# Patient Record
Sex: Female | Born: 2005 | Race: White | Hispanic: No | Marital: Single | Smoking: Never smoker
Health system: Southern US, Community
[De-identification: ages and names within clinical notes are randomized; demographics above are authoritative.]

## PROBLEM LIST (undated history)

## (undated) DIAGNOSIS — G039 Meningitis, unspecified: Secondary | ICD-10-CM

---
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
## 2023-04-27 ENCOUNTER — Encounter (HOSPITAL_COMMUNITY): Payer: Self-pay

## 2023-04-27 ENCOUNTER — Other Ambulatory Visit: Payer: Self-pay

## 2023-04-27 ENCOUNTER — Emergency Department (HOSPITAL_COMMUNITY): Payer: Medicaid Other

## 2023-04-27 ENCOUNTER — Emergency Department (HOSPITAL_COMMUNITY)
Admission: EM | Admit: 2023-04-27 | Discharge: 2023-04-28 | Disposition: A | Discharge status: Home / Self Care | Payer: Medicaid Other | Attending: Emergency Medicine | Admitting: Emergency Medicine

## 2023-04-27 DIAGNOSIS — Z20822 Contact with and (suspected) exposure to covid-19: Secondary | ICD-10-CM | POA: Diagnosis not present

## 2023-04-27 DIAGNOSIS — R000 Tachycardia, unspecified: Secondary | ICD-10-CM | POA: Diagnosis not present

## 2023-04-27 DIAGNOSIS — M25559 Pain in unspecified hip: Secondary | ICD-10-CM | POA: Insufficient documentation

## 2023-04-27 DIAGNOSIS — R519 Headache, unspecified: Secondary | ICD-10-CM | POA: Diagnosis present

## 2023-04-27 DIAGNOSIS — F0781 Postconcussional syndrome: Secondary | ICD-10-CM | POA: Diagnosis not present

## 2023-04-27 DIAGNOSIS — B349 Viral infection, unspecified: Secondary | ICD-10-CM | POA: Insufficient documentation

## 2023-04-27 DIAGNOSIS — R109 Unspecified abdominal pain: Secondary | ICD-10-CM | POA: Diagnosis not present

## 2023-04-27 LAB — CBC WITH DIFFERENTIAL/PLATELET
Abs Immature Granulocytes: 0.01 10*3/uL (ref 0.00–0.07)
Basophils Absolute: 0 10*3/uL (ref 0.0–0.1)
Basophils Relative: 0 %
Eosinophils Absolute: 0 10*3/uL (ref 0.0–0.5)
Eosinophils Relative: 0 %
HCT: 43.4 % (ref 36.0–46.0)
Hemoglobin: 14.2 g/dL (ref 12.0–15.0)
Immature Granulocytes: 0 %
Lymphocytes Relative: 4 %
Lymphs Abs: 0.2 10*3/uL — ABNORMAL LOW (ref 0.7–4.0)
MCH: 29.6 pg (ref 26.0–34.0)
MCHC: 32.7 g/dL (ref 30.0–36.0)
MCV: 90.4 fL (ref 80.0–100.0)
Monocytes Absolute: 0.4 10*3/uL (ref 0.1–1.0)
Monocytes Relative: 6 %
Neutro Abs: 5.5 10*3/uL (ref 1.7–7.7)
Neutrophils Relative %: 90 %
Platelets: 209 10*3/uL (ref 150–400)
RBC: 4.8 MIL/uL (ref 3.87–5.11)
RDW: 13.3 % (ref 11.5–15.5)
WBC: 6.2 10*3/uL (ref 4.0–10.5)
nRBC: 0 % (ref 0.0–0.2)

## 2023-04-27 LAB — URINALYSIS, ROUTINE W REFLEX MICROSCOPIC
Bilirubin Urine: NEGATIVE
Glucose, UA: NEGATIVE mg/dL
Ketones, ur: 20 mg/dL — AB
Leukocytes,Ua: NEGATIVE
Nitrite: NEGATIVE
Protein, ur: 100 mg/dL — AB
RBC / HPF: >50 RBC/hpf (ref 0–5)
Specific Gravity, Urine: 1.03 (ref 1.005–1.030)
Trans Epithel, UA: 1
pH: 5 (ref 5.0–8.0)

## 2023-04-27 LAB — BASIC METABOLIC PANEL WITH GFR
Anion gap: 10 (ref 5–15)
BUN: 18 mg/dL (ref 6–20)
CO2: 20 mmol/L — ABNORMAL LOW (ref 22–32)
Calcium: 8.7 mg/dL — ABNORMAL LOW (ref 8.9–10.3)
Chloride: 102 mmol/L (ref 98–111)
Creatinine, Ser: 1 mg/dL (ref 0.44–1.00)
GFR, Estimated: >60 mL/min (ref >60)
Glucose, Bld: 109 mg/dL — ABNORMAL HIGH (ref 70–99)
Potassium: 3.3 mmol/L — ABNORMAL LOW (ref 3.5–5.1)
Sodium: 132 mmol/L — ABNORMAL LOW (ref 135–145)

## 2023-04-27 LAB — SARS CORONAVIRUS 2 BY RT PCR: SARS Coronavirus 2 by RT PCR: NEGATIVE

## 2023-04-27 LAB — POC URINE PREG, ED: Preg Test, Ur: NEGATIVE

## 2023-04-27 MED ORDER — LACTATED RINGERS IV BOLUS
1000.0000 mL | Freq: Once | INTRAVENOUS | Status: AC
  Administered 2023-04-27: 1000 mL via INTRAVENOUS

## 2023-04-27 MED ORDER — PROCHLORPERAZINE EDISYLATE 10 MG/2ML IJ SOLN
10.0000 mg | Freq: Once | INTRAMUSCULAR | Status: AC
  Administered 2023-04-27: 10 mg via INTRAVENOUS
  Filled 2023-04-27: qty 2

## 2023-04-27 MED ORDER — DIPHENHYDRAMINE HCL 50 MG/ML IJ SOLN
25.0000 mg | Freq: Once | INTRAMUSCULAR | Status: AC
  Administered 2023-04-27: 25 mg via INTRAVENOUS
  Filled 2023-04-27: qty 1

## 2023-04-27 MED ORDER — ACETAMINOPHEN 325 MG PO TABS
650.0000 mg | ORAL_TABLET | Freq: Once | ORAL | Status: AC
  Administered 2023-04-27: 650 mg via ORAL
  Filled 2023-04-27: qty 2
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
## 2023-04-27 NOTE — ED Notes (Signed)
Patient transported to CT 
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
## 2023-04-28 MED ORDER — ACETAMINOPHEN 500 MG PO TABS: 500.0000 mg | ORAL_TABLET | Freq: Four times a day (QID) | ORAL | 0 refills | Status: AC | PRN

## 2023-04-28 MED ORDER — ONDANSETRON 8 MG PO TBDP: 8.0000 mg | ORAL_TABLET | Freq: Three times a day (TID) | ORAL | 0 refills | Status: AC | PRN
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
